# Patient Record
Sex: Male | Born: 1995 | Hispanic: Yes | Marital: Single | Smoking: Former smoker
Health system: Southern US, Community
[De-identification: ages and names within clinical notes are randomized; demographics above are authoritative.]

## PROBLEM LIST (undated history)

## (undated) HISTORY — PX: APPENDECTOMY: SHX54

---
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
## 2018-12-25 ENCOUNTER — Encounter: Payer: Self-pay | Admitting: Emergency Medicine

## 2018-12-25 ENCOUNTER — Other Ambulatory Visit: Payer: Self-pay

## 2018-12-25 DIAGNOSIS — R1013 Epigastric pain: Secondary | ICD-10-CM | POA: Diagnosis present

## 2018-12-25 DIAGNOSIS — K2920 Alcoholic gastritis without bleeding: Secondary | ICD-10-CM | POA: Diagnosis not present

## 2018-12-25 DIAGNOSIS — L739 Follicular disorder, unspecified: Secondary | ICD-10-CM | POA: Insufficient documentation

## 2018-12-25 DIAGNOSIS — R05 Cough: Secondary | ICD-10-CM | POA: Insufficient documentation

## 2018-12-25 DIAGNOSIS — F17210 Nicotine dependence, cigarettes, uncomplicated: Secondary | ICD-10-CM | POA: Insufficient documentation

## 2018-12-25 LAB — BASIC METABOLIC PANEL
Anion gap: 9 (ref 5–15)
BUN: 12 mg/dL (ref 6–20)
CO2: 24 mmol/L (ref 22–32)
Calcium: 9.3 mg/dL (ref 8.9–10.3)
Chloride: 103 mmol/L (ref 98–111)
Creatinine, Ser: 0.96 mg/dL (ref 0.61–1.24)
GFR calc Af Amer: >60 mL/min (ref >60)
GFR calc non Af Amer: >60 mL/min (ref >60)
Glucose, Bld: 105 mg/dL — ABNORMAL HIGH (ref 70–99)
Potassium: 3.1 mmol/L — ABNORMAL LOW (ref 3.5–5.1)
Sodium: 136 mmol/L (ref 135–145)

## 2018-12-25 LAB — CBC
HCT: 42.4 % (ref 39.0–52.0)
Hemoglobin: 14.8 g/dL (ref 13.0–17.0)
MCH: 35.3 pg — ABNORMAL HIGH (ref 26.0–34.0)
MCHC: 34.9 g/dL (ref 30.0–36.0)
MCV: 101.2 fL — ABNORMAL HIGH (ref 80.0–100.0)
Platelets: 324 10*3/uL (ref 150–400)
RBC: 4.19 MIL/uL — ABNORMAL LOW (ref 4.22–5.81)
RDW: 11.7 % (ref 11.5–15.5)
WBC: 24.5 10*3/uL — ABNORMAL HIGH (ref 4.0–10.5)
nRBC: 0 % (ref 0.0–0.2)

## 2018-12-25 LAB — TROPONIN I (HIGH SENSITIVITY): Troponin I (High Sensitivity): 4 ng/L (ref <18)

## 2018-12-25 NOTE — ED Triage Notes (Signed)
Pt presents to ED with mid chest pain for the past couple of days. Pt states worse when he is "breathing". Pt also concerned about a raised bumps on his inner thighs that he first noticed today. Pt currently has no increased work of breathing or acute distress noted.

## 2018-12-26 ENCOUNTER — Emergency Department
Admission: EM | Admit: 2018-12-26 | Discharge: 2018-12-26 | Disposition: A | Discharge status: Home / Self Care | Payer: BC Managed Care – PPO | Attending: Emergency Medicine | Admitting: Emergency Medicine

## 2018-12-26 ENCOUNTER — Emergency Department: Payer: BC Managed Care – PPO

## 2018-12-26 DIAGNOSIS — K2920 Alcoholic gastritis without bleeding: Secondary | ICD-10-CM

## 2018-12-26 DIAGNOSIS — R079 Chest pain, unspecified: Secondary | ICD-10-CM

## 2018-12-26 DIAGNOSIS — L739 Follicular disorder, unspecified: Secondary | ICD-10-CM

## 2018-12-26 DIAGNOSIS — R1013 Epigastric pain: Secondary | ICD-10-CM

## 2018-12-26 LAB — RAPID HIV SCREEN (HIV 1/2 AB+AG)
HIV 1/2 Antibodies: NONREACTIVE
HIV-1 P24 Antigen - HIV24: NONREACTIVE

## 2018-12-26 LAB — HEPATIC FUNCTION PANEL
ALT: 18 U/L (ref 0–44)
AST: 24 U/L (ref 15–41)
Albumin: 4.6 g/dL (ref 3.5–5.0)
Alkaline Phosphatase: 66 U/L (ref 38–126)
Bilirubin, Direct: 0.2 mg/dL (ref 0.0–0.2)
Indirect Bilirubin: 1.3 mg/dL — ABNORMAL HIGH (ref 0.3–0.9)
Total Bilirubin: 1.5 mg/dL — ABNORMAL HIGH (ref 0.3–1.2)
Total Protein: 7.6 g/dL (ref 6.5–8.1)

## 2018-12-26 LAB — TROPONIN I (HIGH SENSITIVITY): Troponin I (High Sensitivity): 3 ng/L (ref <18)

## 2018-12-26 LAB — LIPASE, BLOOD: Lipase: 23 U/L (ref 11–51)

## 2018-12-26 LAB — FIBRIN DERIVATIVES D-DIMER (ARMC ONLY): Fibrin derivatives D-dimer (ARMC): 303.05 ng/mL (FEU) (ref 0.00–499.00)

## 2018-12-26 MED ORDER — FAMOTIDINE 40 MG PO TABS: 40.0000 mg | ORAL_TABLET | Freq: Every day | ORAL | 0 refills | Status: DC

## 2018-12-26 MED ORDER — ALUM & MAG HYDROXIDE-SIMETH 200-200-20 MG/5ML PO SUSP
30.0000 mL | Freq: Once | ORAL | Status: AC
  Administered 2018-12-26: 30 mL via ORAL
  Filled 2018-12-26: qty 30

## 2018-12-26 MED ORDER — CLINDAMYCIN HCL 300 MG PO CAPS: 300.0000 mg | ORAL_CAPSULE | Freq: Three times a day (TID) | ORAL | 0 refills | Status: AC

## 2018-12-26 MED ORDER — FAMOTIDINE 20 MG PO TABS
20.0000 mg | ORAL_TABLET | Freq: Once | ORAL | Status: AC
  Administered 2018-12-26: 20 mg via ORAL
  Filled 2018-12-26: qty 1

## 2018-12-26 MED ORDER — CLINDAMYCIN HCL 150 MG PO CAPS
300.0000 mg | ORAL_CAPSULE | Freq: Once | ORAL | Status: AC
  Administered 2018-12-26: 300 mg via ORAL
  Filled 2018-12-26: qty 2

## 2018-12-26 MED ORDER — LIDOCAINE VISCOUS HCL 2 % MT SOLN
15.0000 mL | Freq: Once | OROMUCOSAL | Status: AC
  Administered 2018-12-26: 15 mL via ORAL
  Filled 2018-12-26: qty 15

## 2018-12-26 NOTE — ED Provider Notes (Signed)
[facility] Emergency Department Provider Note  ____________________________________________  Time seen: Approximately 2:24 AM  I have reviewed the triage vital signs and the nursing notes.   HISTORY  Chief Complaint Chest Pain   HPI Dean Alvarez is a 23 y.o. male no significant past medical history who presents for evaluation of epigastric pain and rash.  Patient reports an intermittent squeezing epigastric abdominal pain that he has had for a week.  The pain became worse today and it was severe.  At this time the pain has improved.  Currently 4 out of 10.  Has had no nausea, no vomiting, no chest pain, no shortness of breath, no fever or chills, no diarrhea.  Patient does report that over the last 3 to 4 weeks he has been drinking a lot of alcohol.  Has been drinking 12 beers a day every weekend.  Last alcohol intake was 2 days ago.  He denies any history of peptic ulcer disease.  He denies drug use.  He is a smoker.  He denies any personal or family history of heart attacks, blood clots, recent travel immobilization, leg pain or swelling, hemoptysis, or exogenous hormones.  He does report a mild cough for the last week that is productive of clear phlegm.  No fever or chills.  No known exposures to COVID.  Patient also noticed a rash that appeared in his bilateral inner thighs this morning.   PMH None - reviewed  Allergies Patient has no known allergies.  FH No history of CAD, DVT, PE  Social History Social History   Tobacco Use  . Smoking status: Current Every Day Smoker    Types: Cigarettes  . Smokeless tobacco: Never Used  Substance Use Topics  . Alcohol use: Yes    Frequency: Never  . Drug use: Not Currently    Review of Systems  Constitutional: Negative for fever. Eyes: Negative for visual changes. ENT: Negative for sore throat. Neck: No neck pain  Cardiovascular: Negative for chest pain. Respiratory: Negative for shortness of  breath. Gastrointestinal: + epigastric pain. Negative for abdominal pain, vomiting or diarrhea. Genitourinary: Negative for dysuria. Musculoskeletal: Negative for back pain. Skin: + rash. Neurological: Negative for headaches, weakness or numbness. Psych: No SI or HI  ____________________________________________   PHYSICAL EXAM:  VITAL SIGNS: ED Triage Vitals  Enc Vitals Group     BP 12/25/18 2144 137/80     Pulse Rate 12/25/18 2144 (!) 102     Resp 12/25/18 2144 18     Temp 12/25/18 2144 99.8 F (37.7 C)     Temp Source 12/25/18 2144 Oral     SpO2 12/25/18 2144 100 %     Weight 12/25/18 2145 140 lb (63.5 kg)     Height --      Head Circumference --      Peak Flow --      Pain Score 12/25/18 2144 8     Pain Loc --      Pain Edu? --      Excl. in GC? --     Constitutional: Alert and oriented. Well appearing and in no apparent distress. HEENT:      Head: Normocephalic and atraumatic.         Eyes: Conjunctivae are normal. Sclera is non-icteric.       Mouth/Throat: Mucous membranes are moist.       Neck: Supple with no signs of meningismus. Cardiovascular: Tachycardic with regular rhythm. No murmurs, gallops, or rubs.  2+ symmetrical distal pulses are present in all extremities. No JVD. Respiratory: Normal respiratory effort. Lungs are clear to auscultation bilaterally. No wheezes, crackles, or rhonchi.  Gastrointestinal: Soft, non tender, and non distended with positive bowel sounds. No rebound or guarding. Musculoskeletal: Nontender with normal range of motion in all extremities. No edema, cyanosis, or erythema of extremities. Neurologic: Normal speech and language. Face is symmetric. Moving all extremities. No gross focal neurologic deficits are appreciated. Skin: Skin is warm, dry and intact. There are several pustules on an erythematous warm base in the bilateral inner thighs. No involvement of the scrotum or penis Psychiatric: Mood and affect are normal. Speech and  behavior are normal.  ____________________________________________   LABS (all labs ordered are listed, but only abnormal results are displayed)  Labs Reviewed  BASIC METABOLIC PANEL - Abnormal; Notable for the following components:      Result Value   Potassium 3.1 (*)    Glucose, Bld 105 (*)    All other components within normal limits  CBC - Abnormal; Notable for the following components:   WBC 24.5 (*)    RBC 4.19 (*)    MCV 101.2 (*)    MCH 35.3 (*)    All other components within normal limits  HEPATIC FUNCTION PANEL - Abnormal; Notable for the following components:   Total Bilirubin 1.5 (*)    Indirect Bilirubin 1.3 (*)    All other components within normal limits  LIPASE, BLOOD  FIBRIN DERIVATIVES D-DIMER ([facility] ONLY)  RAPID HIV SCREEN (HIV 1/2 AB+AG)  TROPONIN I (HIGH SENSITIVITY)  TROPONIN I (HIGH SENSITIVITY)   ____________________________________________  EKG  ED ECG REPORT I, Nita Sicklearolina Kimanh Templeman, the attending physician, personally viewed and interpreted this ECG.  Sinus tachycardia, rate of 117, normal intervals, normal axis, no ST elevations or depressions, T wave inversions in inferior and lateral leads.  No prior for comparison. ____________________________________________  RADIOLOGY  I have personally reviewed the images performed during this visit and I agree with the Radiologist's read.   Interpretation by Radiologist:  Dg Chest Port 1 View  Result Date: 12/26/2018 CLINICAL DATA:  23 year old male with chest pain. EXAM: PORTABLE CHEST 1 VIEW COMPARISON:  None. FINDINGS: Portable AP upright view at 0118 hours. Lung volumes and mediastinal contours are within normal limits. Visualized tracheal air column is within normal limits. Allowing for portable technique the lungs are clear. No pneumothorax. Negative visible bowel gas and osseous structures. IMPRESSION: Negative portable chest. Electronically Signed   By: Odessa FlemingH  Hall M.D.   On: 12/26/2018 01:31      ____________________________________________   PROCEDURES  Procedure(s) performed: None Procedures Critical Care performed:  None ____________________________________________   INITIAL IMPRESSION / ASSESSMENT AND PLAN / ED COURSE   23 y.o. male no significant past medical history who presents for evaluation of epigastric pain and rash.  # intermittent epigastric abdominal pain: No tenderness on exam, lungs CTAB, tachycardic, no respiratory distress. + mild cough and heavy alcohol use. Ddx gastritis vs PUD vs pancreatitis vs GB. Heart score if 2, HS troponin x2 negative. Low suspicion for ACS. D-dimer negative. CXR negative for PNA. No fever, no SOB, no myalgias therefore less likely covid.  LFTs and lipase within normal limits.  # rash: patient with folliculitis of bilateral inner thighs. Has elevated WBC with no other signs of infection elsewhere. Started on clindamycin.    Discussed and return precautions and follow-up with primary care doctor.    As part of my medical decision making, I reviewed  the following data within the [place] notes reviewed and incorporated, Labs reviewed , EKG interpreted , Old chart reviewed, Radiograph reviewed , Notes from prior ED visits and [place] Controlled Substance Database   Patient was evaluated in Emergency Department today for the symptoms described in the history of present illness. Patient was evaluated in the context of the global COVID-19 pandemic, which necessitated consideration that the patient might be at risk for infection with the SARS-CoV-2 virus that causes COVID-19. Institutional protocols and algorithms that pertain to the evaluation of patients at risk for COVID-19 are in a state of rapid change based on information released by regulatory bodies including the CDC and federal and state organizations. These policies and algorithms were followed during the patient's care in the ED.    ____________________________________________   FINAL CLINICAL IMPRESSION(S) / ED DIAGNOSES   Final diagnoses:  Epigastric pain  Folliculitis  Acute alcoholic gastritis without hemorrhage      NEW MEDICATIONS STARTED DURING THIS VISIT:  ED Discharge Orders         Ordered    clindamycin (CLEOCIN) 300 MG capsule  3 times daily     12/26/18 0302    famotidine (PEPCID) 40 MG tablet  Daily at bedtime     12/26/18 0302           Note:  This document was prepared using Dragon voice recognition software and may include unintentional dictation errors.    Alfred Levins, [place], MD 12/26/18 [phone]

## 2018-12-26 NOTE — ED Notes (Signed)
Provided urinal for patient to void

## 2018-12-26 NOTE — ED Notes (Signed)
Patient has pustular rash on groin and thighs

## 2020-05-13 ENCOUNTER — Ambulatory Visit
Admission: EM | Admit: 2020-05-13 | Discharge: 2020-05-13 | Disposition: A | Discharge status: Home / Self Care | Payer: BC Managed Care – PPO | Attending: Family Medicine | Admitting: Family Medicine

## 2020-05-13 ENCOUNTER — Other Ambulatory Visit: Payer: Self-pay

## 2020-05-13 ENCOUNTER — Encounter: Payer: Self-pay | Admitting: Emergency Medicine

## 2020-05-13 DIAGNOSIS — U071 COVID-19: Secondary | ICD-10-CM | POA: Diagnosis not present

## 2020-05-13 LAB — RESP PANEL BY RT-PCR (FLU A&B, COVID) ARPGX2
Influenza A by PCR: NEGATIVE
Influenza B by PCR: NEGATIVE
SARS Coronavirus 2 by RT PCR: POSITIVE — AB

## 2020-05-13 LAB — GROUP A STREP BY PCR: Group A Strep by PCR: NOT DETECTED

## 2020-05-13 MED ORDER — ALBUTEROL SULFATE HFA 108 (90 BASE) MCG/ACT IN AERS: 1.0000 | INHALATION_SPRAY | Freq: Four times a day (QID) | RESPIRATORY_TRACT | 0 refills | Status: DC | PRN

## 2020-05-13 MED ORDER — NAPROXEN 500 MG PO TABS: 500.0000 mg | ORAL_TABLET | Freq: Two times a day (BID) | ORAL | 0 refills | Status: DC | PRN

## 2020-05-13 NOTE — ED Provider Notes (Signed)
[facility]    CSN: [id number] Arrival date & time: 05/13/20  1200  History   Chief Complaint Chief Complaint  Patient presents with   Generalized Body Aches   Sore Throat   Headache   Cough   Nasal Congestion   HPI  24 year old male presents with the above complaints.  2-day history of symptoms.  Reports body aches, headache, sore throat, difficulty breathing.  No documented fever.  He has taken over-the-counter Tylenol without relief.  He has not been vaccinated.  No reported direct sick contacts.  No other associated symptoms.  No other complaints.  Past Surgical History:  Procedure Laterality Date   APPENDECTOMY     Home Medications    Prior to Admission medications   Medication Sig Start Date End Date Taking? Authorizing Provider  albuterol (VENTOLIN HFA) 108 (90 Base) MCG/ACT inhaler Inhale 1-2 puffs into the lungs every 6 (six) hours as needed for wheezing or shortness of breath. 05/13/20  Yes Jamelah Sitzer G, DO  naproxen (NAPROSYN) 500 MG tablet Take 1 tablet (500 mg total) by mouth 2 (two) times daily as needed for moderate pain. 05/13/20  Yes Arvon Schreiner G, DO  famotidine (PEPCID) 40 MG tablet Take 1 tablet (40 mg total) by mouth at bedtime. 12/26/18 05/13/20  Nita Sickle, MD    Family History Family History  Problem Relation Age of Onset   Cancer Mother    Hyperlipidemia Father     Social History Social History   Tobacco Use   Smoking status: Former Smoker    Types: Cigarettes    Quit date: 11/12/2019    Years since quitting: 0.5   Smokeless tobacco: Never Used  Vaping Use   Vaping Use: Never used  Substance Use Topics   Alcohol use: Yes    Comment: sometimes   Drug use: Not Currently     Allergies   Patient has no known allergies.   Review of Systems Review of Systems Per HPI  Physical Exam Triage Vital Signs ED Triage Vitals  Enc Vitals Group     BP 05/13/20 1501 122/75     Pulse Rate 05/13/20 1501 88      Resp 05/13/20 1501 18     Temp 05/13/20 1501 98.9 F (37.2 C)     Temp Source 05/13/20 1501 Oral     SpO2 05/13/20 1501 98 %     Weight 05/13/20 1502 193 lb (87.5 kg)     Height 05/13/20 1502 5\' 7"  (1.702 m)     Head Circumference --      Peak Flow --      Pain Score 05/13/20 1501 8     Pain Loc --      Pain Edu? --      Excl. in GC? --     Updated Vital Signs BP 122/75 (BP Location: Left Arm)    Pulse 88    Temp 98.9 F (37.2 C) (Oral)    Resp 18    Ht 5\' 7"  (1.702 m)    Wt 87.5 kg    SpO2 98%    BMI 30.23 kg/m   Visual Acuity Right Eye Distance:   Left Eye Distance:   Bilateral Distance:    Right Eye Near:   Left Eye Near:    Bilateral Near:     Physical Exam Vitals and nursing note reviewed.  Constitutional:      General: He is not in acute distress.    Appearance: Normal appearance. He  is not ill-appearing.  HENT:     Head: Normocephalic and atraumatic.     Mouth/Throat:     Pharynx: Oropharynx is clear. No oropharyngeal exudate.  Eyes:     General:        Right eye: No discharge.        Left eye: No discharge.     Conjunctiva/sclera: Conjunctivae normal.  Cardiovascular:     Rate and Rhythm: Normal rate and regular rhythm.     Heart sounds: No murmur heard.   Pulmonary:     Effort: Pulmonary effort is normal.     Breath sounds: Normal breath sounds. No wheezing, rhonchi or rales.  Neurological:     Mental Status: He is alert.  Psychiatric:        Mood and Affect: Mood normal.        Behavior: Behavior normal.    UC Treatments / Results  Labs (all labs ordered are listed, but only abnormal results are displayed) Labs Reviewed  RESP PANEL BY RT-PCR (FLU A&B, COVID) ARPGX2 - Abnormal; Notable for the following components:      Result Value   SARS Coronavirus 2 by RT PCR POSITIVE (*)    All other components within normal limits  GROUP A STREP BY PCR    EKG   Radiology No results found.  Procedures Procedures (including critical care  time)  Medications Ordered in UC Medications - No data to display  Initial Impression / Assessment and Plan / UC Course  I have reviewed the triage vital signs and the nursing notes.  Pertinent labs & imaging results that were available during my care of the patient were reviewed by me and considered in my medical decision making (see chart for details).    24 year old male presents with COVID-19.  Well-appearing on exam.  Afebrile.  Naproxen and albuterol as directed.  Work note given.  Advised to go to the hospital if he worsens.  Final Clinical Impressions(s) / UC Diagnoses   Final diagnoses:  COVID   Discharge Instructions   None    ED Prescriptions    Medication Sig Dispense Auth. Provider   naproxen (NAPROSYN) 500 MG tablet Take 1 tablet (500 mg total) by mouth 2 (two) times daily as needed for moderate pain. 30 tablet Aydenn Gervin G, DO   albuterol (VENTOLIN HFA) 108 (90 Base) MCG/ACT inhaler Inhale 1-2 puffs into the lungs every 6 (six) hours as needed for wheezing or shortness of breath. 18 g Tommie Sams, DO     PDMP not reviewed this encounter.   Tommie Sams, [place] 05/13/20 1627

## 2020-05-13 NOTE — ED Triage Notes (Signed)
Patient in today c/o a 2 day history of body aches, cough, sore throat, headache and nasal congestion. Patient has not checked his temperature. Patient has taken OTC Tylenol. Patient has not had the covid vaccines.

## 2020-05-22 ENCOUNTER — Ambulatory Visit
Admission: EM | Admit: 2020-05-22 | Discharge: 2020-05-22 | Disposition: A | Discharge status: Home / Self Care | Payer: BC Managed Care – PPO | Attending: Physician Assistant | Admitting: Physician Assistant

## 2020-05-22 ENCOUNTER — Other Ambulatory Visit: Payer: Self-pay

## 2020-05-22 ENCOUNTER — Encounter: Payer: Self-pay | Admitting: Emergency Medicine

## 2020-05-22 ENCOUNTER — Ambulatory Visit (INDEPENDENT_AMBULATORY_CARE_PROVIDER_SITE_OTHER): Payer: BC Managed Care – PPO

## 2020-05-22 DIAGNOSIS — U071 COVID-19: Secondary | ICD-10-CM

## 2020-05-22 DIAGNOSIS — R059 Cough, unspecified: Secondary | ICD-10-CM

## 2020-05-22 DIAGNOSIS — R0602 Shortness of breath: Secondary | ICD-10-CM | POA: Diagnosis not present

## 2020-05-22 DIAGNOSIS — J1282 Pneumonia due to coronavirus disease 2019: Secondary | ICD-10-CM

## 2020-05-22 MED ORDER — ALBUTEROL SULFATE HFA 108 (90 BASE) MCG/ACT IN AERS: 1.0000 | INHALATION_SPRAY | Freq: Four times a day (QID) | RESPIRATORY_TRACT | 0 refills | Status: DC | PRN

## 2020-05-22 MED ORDER — PSEUDOEPH-BROMPHEN-DM 30-2-10 MG/5ML PO SYRP: 10.0000 mL | ORAL_SOLUTION | Freq: Four times a day (QID) | ORAL | 0 refills | Status: AC | PRN

## 2020-05-22 MED ORDER — PREDNISONE 20 MG PO TABS: 40.0000 mg | ORAL_TABLET | Freq: Every day | ORAL | 0 refills | Status: AC

## 2020-05-22 NOTE — ED Provider Notes (Signed)
[facility]    CSN: [id number] Arrival date & time: 05/22/20  1344      History   Chief Complaint Chief Complaint  Patient presents with  . Covid Positive  . Cough    HPI Dean Alvarez is a 25 y.o. male, COVID unvaccinated, presenting for 11-day history of cough shortness of breath.  Patient was diagnosed with COVID-19 on 05/13/2020.  Patient says that he has completed his isolation and does not feel much better.  He says he continues to have some symptoms including those mentioned as well as loss of taste.  Patient says that over the past 5 to 6 days he started to feel some chest burning and worsening shortness of breath.  He also says that his cough is occasionally productive of mucus.  He says he has been using his albuterol inhaler as provided at the last visit every 6 hours like it says on the prescription.  He says he is also taking the naproxen.  He denies taking any other medications.  Patient says he is otherwise healthy without any chronic medical problems.  Denies any history of cardiopulmonary disease.  He has no other concerns today.  HPI  History reviewed. No pertinent past medical history.  There are no problems to display for this patient.   Past Surgical History:  Procedure Laterality Date  . APPENDECTOMY         Home Medications    Prior to Admission medications   Medication Sig Start Date End Date Taking? Authorizing Provider  brompheniramine-pseudoephedrine-DM 30-2-10 MG/5ML syrup Take 10 mLs by mouth 4 (four) times daily as needed for up to 7 days. 05/22/20 05/29/20 Yes Laurene Footman B, PA-C  naproxen (NAPROSYN) 500 MG tablet Take 1 tablet (500 mg total) by mouth 2 (two) times daily as needed for moderate pain. 05/13/20  Yes Cook, Jayce G, DO  predniSONE (DELTASONE) 20 MG tablet Take 2 tablets (40 mg total) by mouth daily for 5 days. 05/22/20 05/27/20 Yes Danton Clap, PA-C  albuterol (VENTOLIN HFA) 108 (90 Base) MCG/ACT inhaler Inhale 1-2  puffs into the lungs every 6 (six) hours as needed for up to 10 days for wheezing or shortness of breath. 05/22/20 06/01/20  Danton Clap, PA-C  famotidine (PEPCID) 40 MG tablet Take 1 tablet (40 mg total) by mouth at bedtime. 12/26/18 05/13/20  Rudene Re, MD    Family History Family History  Problem Relation Age of Onset  . Cancer Mother   . Hyperlipidemia Father     Social History Social History   Tobacco Use  . Smoking status: Former Smoker    Types: Cigarettes    Quit date: 11/12/2019    Years since quitting: 0.5  . Smokeless tobacco: Never Used  Vaping Use  . Vaping Use: Never used  Substance Use Topics  . Alcohol use: Yes    Comment: sometimes  . Drug use: Not Currently     Allergies   Patient has no known allergies.   Review of Systems Review of Systems  Constitutional: Negative for fatigue and fever.  HENT: Positive for congestion. Negative for rhinorrhea, sinus pressure, sinus pain and sore throat.   Respiratory: Positive for cough and shortness of breath. Negative for wheezing.   Cardiovascular: Negative for chest pain.  Gastrointestinal: Negative for abdominal pain, diarrhea, nausea and vomiting.  Musculoskeletal: Negative for myalgias.  Neurological: Negative for weakness, light-headedness and headaches.  Hematological: Negative for adenopathy.     Physical Exam Triage Vital  Signs ED Triage Vitals  Enc Vitals Group     BP 05/22/20 1521 102/60     Pulse Rate 05/22/20 1521 94     Resp 05/22/20 1521 18     Temp 05/22/20 1521 98.1 F (36.7 C)     Temp Source 05/22/20 1521 Oral     SpO2 05/22/20 1521 98 %     Weight 05/22/20 1519 190 lb (86.2 kg)     Height 05/22/20 1519 5\' 7"  (1.702 m)     Head Circumference --      Peak Flow --      Pain Score 05/22/20 1519 0     Pain Loc --      Pain Edu? --      Excl. in GC? --    No data found.  Updated Vital Signs BP 102/60 (BP Location: Right Arm)   Pulse 94   Temp 98.1 F (36.7 C) (Oral)    Resp 18   Ht 5\' 7"  (1.702 m)   Wt 190 lb (86.2 kg)   SpO2 98%   BMI 29.76 kg/m    Physical Exam Vitals and nursing note reviewed.  Constitutional:      General: He is not in acute distress.    Appearance: Normal appearance. He is well-developed and well-nourished. He is not ill-appearing or diaphoretic.  HENT:     Head: Normocephalic and atraumatic.     Nose: Nose normal.     Mouth/Throat:     Mouth: Oropharynx is clear and moist and mucous membranes are normal. Mucous membranes are moist.     Pharynx: Oropharynx is clear. Uvula midline. No oropharyngeal exudate.     Tonsils: No tonsillar abscesses.  Eyes:     General: No scleral icterus.       Right eye: No discharge.        Left eye: No discharge.     Extraocular Movements: EOM normal.     Conjunctiva/sclera: Conjunctivae normal.  Neck:     Thyroid: No thyromegaly.     Trachea: No tracheal deviation.  Cardiovascular:     Rate and Rhythm: Normal rate and regular rhythm.     Heart sounds: Normal heart sounds.  Pulmonary:     Effort: Pulmonary effort is normal. No respiratory distress.     Breath sounds: Normal breath sounds. No wheezing, rhonchi or rales.  Musculoskeletal:     Cervical back: Neck supple.  Skin:    General: Skin is warm and dry.     Findings: No rash.  Neurological:     General: No focal deficit present.     Mental Status: He is alert. Mental status is at baseline.     Motor: No weakness.     Gait: Gait normal.  Psychiatric:        Mood and Affect: Mood normal.        Thought Content: Thought content normal.      UC Treatments / Results  Labs (all labs ordered are listed, but only abnormal results are displayed) Labs Reviewed - No data to display  EKG   Radiology DG Chest 2 View  Result Date: 05/22/2020 CLINICAL DATA:  Cough and shortness of breath.  COVID positive EXAM: CHEST - 2 VIEW COMPARISON:  12/26/2018 FINDINGS: There are few subtle airspace opacities bilaterally most notably in the  lower lung zones. No pneumothorax. No large pleural effusion. The heart size is unremarkable. There is no acute osseous abnormality. IMPRESSION: Subtle bilateral airspace opacities as can  be seen in patients with viral pneumonia. Electronically Signed   By: Katherine Mantle M.D.   On: 05/22/2020 15:43    Procedures Procedures (including critical care time)  Medications Ordered in UC Medications - No data to display  Initial Impression / Assessment and Plan / UC Course  I have reviewed the triage vital signs and the nursing notes.  Pertinent labs & imaging results that were available during my care of the patient were reviewed by me and considered in my medical decision making (see chart for details).   All vital signs are normal and stable.  Exam is benign.  Chest x-ray performed today due to complaint of continued shortness of breath and cough after COVID-19 diagnosis a week and half ago. CXR independently reviewed by me.  Chest x-ray shows subtle bilateral airspace opacities which seem to be consistent with Covid viral pneumonia.  Discussed results with patient.  Advised him to continue using the inhaler, but advised him to only use as needed and not every 6 hours if he does not need it.  I have sent Bromfed syrup to help with cough.  Also prescribed prednisone.  I refilled his albuterol inhaler.  Return and ED precautions reviewed with patient.   Final Clinical Impressions(s) / UC Diagnoses   Final diagnoses:  Pneumonia due to COVID-19 virus  Cough  Shortness of breath     Discharge Instructions     La radiografa de trax muestra neumona compatible con COVID-19. Mejorar, pero puede tomar un poco ms de [employer]. Sus Risk analyst y el gusto pueden durar semanas o meses a veces.  Use el inhalador de albuterol cada 4 a 6 horas segn sea necesario para la dificultad para respirar. Si no lo necesita, no lo use.  Tambin he enviado un medicamento para la tos y Chartered certified accountant. Use este medicamento cada 6 horas segn sea necesario para la tos y la congestin.  Adems, le he enviado prednisona que le ayudar a disminuir la inflamacin y [employer]. Tomar 2 comprimidos por la [employer].  Si tiene fiebre, empeora la tos o aumenta la dificultad para Industrial/product designer, debe volver a verlo.  Chest x-ray shows pneumonia consistent with COVID-19.  You will get better, but it may take a little longer to do so.  Your smell and taste changes can last for weeks or months sometimes.  Use the albuterol inhaler every 4-6 hours as needed for shortness of breath.  If you do not need it do not use it.  I have sent a medication for cough and congestion as well.  Use this medication every 6 hours as needed for cough and congestion.  Also, I have sent prednisone which will help to decrease the inflammation and open up your lungs.  Take 2 tablets in the morning for 5 days.  If you develop fever, have worse cough, or increased breathing difficulty, you should be seen again.    ED Prescriptions    Medication Sig Dispense Auth. Provider   albuterol (VENTOLIN HFA) 108 (90 Base) MCG/ACT inhaler Inhale 1-2 puffs into the lungs every 6 (six) hours as needed for up to 10 days for wheezing or shortness of breath. 18 g Eusebio Friendly B, PA-C   brompheniramine-pseudoephedrine-DM 30-2-10 MG/5ML syrup Take 10 mLs by mouth 4 (four) times daily as needed for up to 7 days. 150 mL Eusebio Friendly B, PA-C   predniSONE (DELTASONE) 20 MG tablet Take 2 tablets (40 mg  total) by mouth daily for 5 days. 10 tablet Gareth Morgan     PDMP not reviewed this encounter.   Shirlee Latch, PA-C 05/22/20 1622

## 2020-05-22 NOTE — Discharge Instructions (Addendum)
La radiografa de trax muestra neumona compatible con COVID-19. Mejorar, pero puede tomar un poco ms de [employer]. Sus Risk analyst y el gusto pueden durar semanas o meses a veces.  Use el inhalador de albuterol cada 4 a 6 horas segn sea necesario para la dificultad para respirar. Si no lo necesita, no lo use.  Tambin he enviado un medicamento para la tos y Sales executive. Use este medicamento cada 6 horas segn sea necesario para la tos y la congestin.  Adems, le he enviado prednisona que le ayudar a disminuir la inflamacin y [employer]. Tomar 2 comprimidos por la [employer].  Si tiene fiebre, empeora la tos o aumenta la dificultad para Industrial/product designer, debe volver a verlo.  Chest x-ray shows pneumonia consistent with COVID-19.  You will get better, but it may take a little longer to do so.  Your smell and taste changes can last for weeks or months sometimes.  Use the albuterol inhaler every 4-6 hours as needed for shortness of breath.  If you do not need it do not use it.  I have sent a medication for cough and congestion as well.  Use this medication every 6 hours as needed for cough and congestion.  Also, I have sent prednisone which will help to decrease the inflammation and open up your lungs.  Take 2 tablets in the morning for 5 days.  If you develop fever, have worse cough, or increased breathing difficulty, you should be seen again.

## 2020-05-22 NOTE — ED Triage Notes (Signed)
Patient states he was COVID positive 10 days ago. He states he has continued to have SOB and cough. He still reports loss of taste.

## 2020-05-24 ENCOUNTER — Other Ambulatory Visit: Payer: Self-pay

## 2020-05-24 ENCOUNTER — Ambulatory Visit
Admission: EM | Admit: 2020-05-24 | Discharge: 2020-05-24 | Discharge status: Against Medical Advice | Payer: BC Managed Care – PPO

## 2021-07-26 IMAGING — CR DG CHEST 2V
3 series · 3 of 3 positions shown · non-contrast
Comparison: 12/26/2018

CLINICAL DATA: Cough and shortness of breath.  COVID positive

EXAM:
CHEST - 2 VIEW

[chest pa]
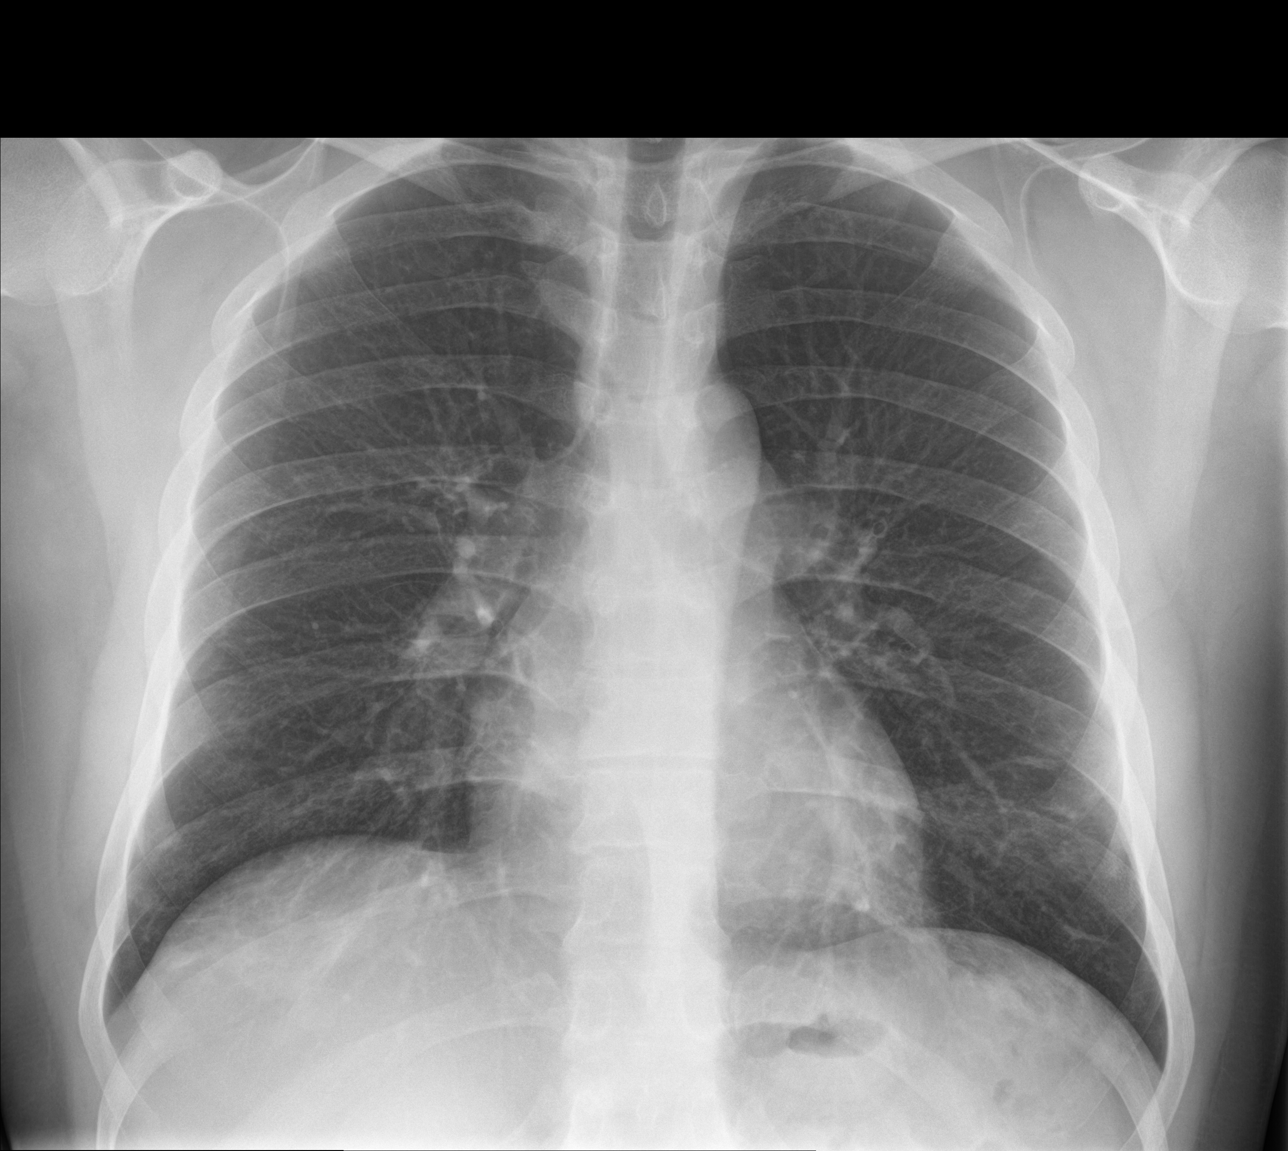

[chest lat (1 of 2)]
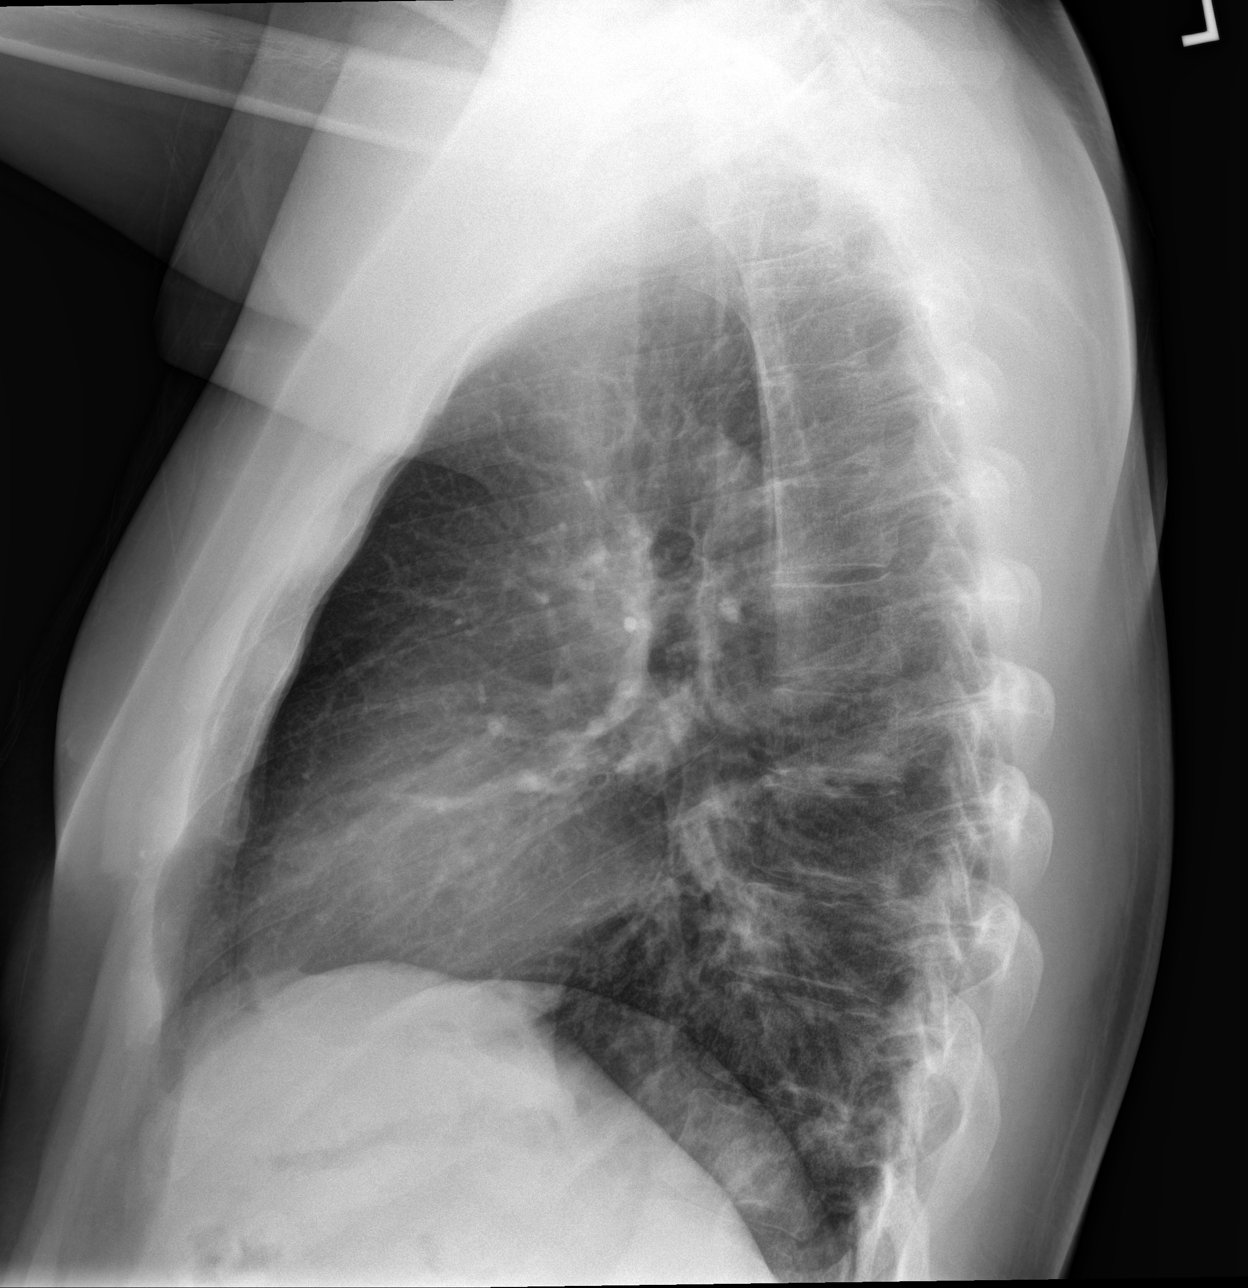

[chest lat (2 of 2)]
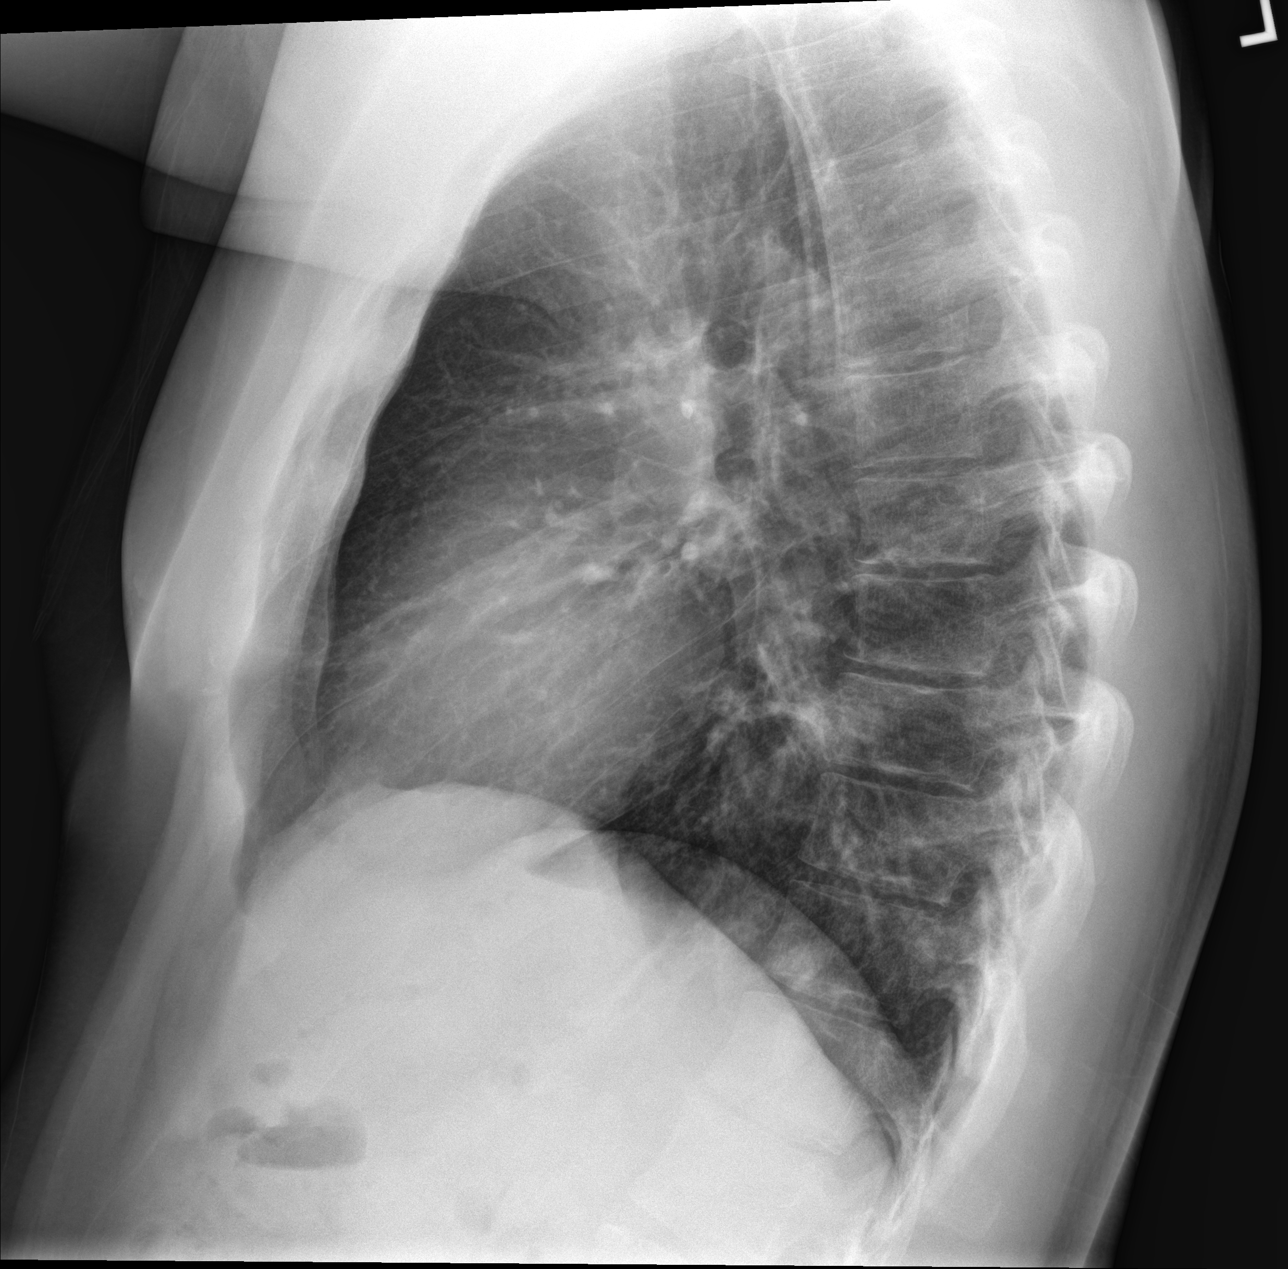

[3 of 3 positions shown; findings below may reference images not displayed]

FINDINGS: There are few subtle airspace opacities bilaterally most notably in
the lower lung zones. No pneumothorax. No large pleural effusion.
The heart size is unremarkable. There is no acute osseous
abnormality.
IMPRESSION: Subtle bilateral airspace opacities as can be seen in patients with
viral pneumonia.

## 2023-05-21 ENCOUNTER — Encounter: Payer: Self-pay | Admitting: Emergency Medicine

## 2023-05-21 ENCOUNTER — Ambulatory Visit (INDEPENDENT_AMBULATORY_CARE_PROVIDER_SITE_OTHER): Payer: BLUE CROSS/BLUE SHIELD

## 2023-05-21 ENCOUNTER — Ambulatory Visit
Admission: EM | Admit: 2023-05-21 | Discharge: 2023-05-21 | Disposition: A | Discharge status: Home / Self Care | Payer: BLUE CROSS/BLUE SHIELD | Attending: Emergency Medicine | Admitting: Emergency Medicine

## 2023-05-21 DIAGNOSIS — S20212A Contusion of left front wall of thorax, initial encounter: Secondary | ICD-10-CM | POA: Diagnosis not present

## 2023-05-21 DIAGNOSIS — R0781 Pleurodynia: Secondary | ICD-10-CM | POA: Diagnosis not present

## 2023-05-21 MED ORDER — HYDROCODONE-ACETAMINOPHEN 5-325 MG PO TABS: 1.0000 | ORAL_TABLET | Freq: Four times a day (QID) | ORAL | 0 refills | Status: AC | PRN

## 2023-05-21 MED ORDER — METHYLPREDNISOLONE 4 MG PO TBPK: ORAL_TABLET | Freq: Every day | ORAL | 0 refills | Status: AC

## 2023-05-21 MED ORDER — MELOXICAM 15 MG PO TABS: 15.0000 mg | ORAL_TABLET | Freq: Every day | ORAL | 0 refills | Status: AC

## 2023-05-21 NOTE — Discharge Instructions (Addendum)
 We did not appreciate any displaced rib fracture, infection or fluid in your lung on today's x-ray.  Take the Mobic  once a day with 1000 mg of Tylenol .  You may take a Tylenol  containing products 2 more times a day as needed.  You can take 1000 mg of Tylenol  for mild to moderate pain or 1-2 Norco for severe pain.  Either take the Tylenol  or the Norco 3 times a day, do not take both.  They both have Tylenol  in them, and too much Tylenol  can hurt your liver.  Do not exceed 4000 mg of Tylenol  from all sources in 24 hours.  The Medrol  Dosepak may help with inflammation, you can try over-the-counter lidocaine  patches in the area of pain.  Go to the ER for coughing, worsening shortness of breath, coughing up blood, fevers above 100.4, pain not controlled with medications, or for other concerns.  Here is a list of primary care providers who are taking new patients:  [facility] [place] Dr. Selinda Ku (sports medicine) Dr. Cathryne Molt [address] [place] [address] [phone]  [facility] at [facility] [address] [place], [place] [address] [phone]  [facility] [place] [address]  [place] [address]  [phone]  [facility] [address] [place], [place] [address] [phone]  [facility] [address] [place]  [phone] [place], [place] [address]  Here are clinics/ other resources who will see you if you do not have insurance. Some have certain criteria that you must meet. Call them and find out what they are:  [facility]: [address], [place], [place] [address] Phone: [phone] Hours: First and Third Saturdays of each Month, 9 a.m. - 1 p.m.  [facility]: [address], [address] [place], [place] [address] Phone: [phone] Hours: Tuesday, 4 p.m. - 8 p.m. Thursday, 1 p.m. - 8 p.m. Wednesday, 9 a.m. - [facility] [address], [place], [place] [address] Phone:  [phone] Pharmacy Phone Number: [phone] Dental Phone Number: [phone] [facility] Insurance Help: [phone]  Dental Hours: Monday - Thursday, 8 a.m. - 6 p.m.  [facility] [address], [place], [place] [address] Phone: [phone] Pharmacy Phone Number: [phone] [facility] Insurance Help: [phone]  [facility] [address] [place], [place] [address] Phone: [phone] Pharmacy Phone Number: [phone] [facility] Insurance Help: [phone]  [facility] [address] [place], [place] [address] Phone: [phone] [facility] Insurance Help: [phone]   [facility] [address], [place], [place] [address] Phone: [phone]  Go to www.goodrx.com  or www.costplusdrugs.com to look up your medications. This will give you a list of where you can find your prescriptions at the most affordable prices. Or ask the pharmacist what the cash price is, or if they have any other discount programs available to help make your medication more affordable. This can be less expensive than what you would pay with insurance.
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
## 2023-05-23 ENCOUNTER — Encounter: Payer: Self-pay | Admitting: Radiology

## 2023-05-23 ENCOUNTER — Emergency Department
Admission: EM | Admit: 2023-05-23 | Discharge: 2023-05-23 | Disposition: A | Discharge status: Home / Self Care | Payer: BLUE CROSS/BLUE SHIELD | Attending: Student in an Organized Health Care Education/Training Program | Admitting: Student in an Organized Health Care Education/Training Program

## 2023-05-23 ENCOUNTER — Emergency Department: Payer: BLUE CROSS/BLUE SHIELD

## 2023-05-23 ENCOUNTER — Other Ambulatory Visit: Payer: Self-pay

## 2023-05-23 DIAGNOSIS — J40 Bronchitis, not specified as acute or chronic: Secondary | ICD-10-CM

## 2023-05-23 DIAGNOSIS — X58XXXD Exposure to other specified factors, subsequent encounter: Secondary | ICD-10-CM | POA: Insufficient documentation

## 2023-05-23 DIAGNOSIS — Z20822 Contact with and (suspected) exposure to covid-19: Secondary | ICD-10-CM | POA: Diagnosis not present

## 2023-05-23 DIAGNOSIS — R109 Unspecified abdominal pain: Secondary | ICD-10-CM | POA: Diagnosis present

## 2023-05-23 DIAGNOSIS — S2232XD Fracture of one rib, left side, subsequent encounter for fracture with routine healing: Secondary | ICD-10-CM | POA: Diagnosis not present

## 2023-05-23 LAB — BASIC METABOLIC PANEL
Anion gap: 16 — ABNORMAL HIGH (ref 5–15)
BUN: 18 mg/dL (ref 6–20)
CO2: 23 mmol/L (ref 22–32)
Calcium: 9.8 mg/dL (ref 8.9–10.3)
Chloride: 100 mmol/L (ref 98–111)
Creatinine, Ser: 0.87 mg/dL (ref 0.61–1.24)
GFR, Estimated: >60 mL/min (ref >60)
Glucose, Bld: 104 mg/dL — ABNORMAL HIGH (ref 70–99)
Potassium: 3.9 mmol/L (ref 3.5–5.1)
Sodium: 139 mmol/L (ref 135–145)

## 2023-05-23 LAB — CBC WITH DIFFERENTIAL/PLATELET
Abs Immature Granulocytes: 0.08 10*3/uL — ABNORMAL HIGH (ref 0.00–0.07)
Basophils Absolute: 0 10*3/uL (ref 0.0–0.1)
Basophils Relative: 0 %
Eosinophils Absolute: 0 10*3/uL (ref 0.0–0.5)
Eosinophils Relative: 0 %
HCT: 45.5 % (ref 39.0–52.0)
Hemoglobin: 15.5 g/dL (ref 13.0–17.0)
Immature Granulocytes: 0 %
Lymphocytes Relative: 11 %
Lymphs Abs: 2 10*3/uL (ref 0.7–4.0)
MCH: 35.1 pg — ABNORMAL HIGH (ref 26.0–34.0)
MCHC: 34.1 g/dL (ref 30.0–36.0)
MCV: 103.2 fL — ABNORMAL HIGH (ref 80.0–100.0)
Monocytes Absolute: 1.3 10*3/uL — ABNORMAL HIGH (ref 0.1–1.0)
Monocytes Relative: 7 %
Neutro Abs: 14.7 10*3/uL — ABNORMAL HIGH (ref 1.7–7.7)
Neutrophils Relative %: 82 %
Platelets: 498 10*3/uL — ABNORMAL HIGH (ref 150–400)
RBC: 4.41 MIL/uL (ref 4.22–5.81)
RDW: 12.5 % (ref 11.5–15.5)
WBC: 18.1 10*3/uL — ABNORMAL HIGH (ref 4.0–10.5)
nRBC: 0 % (ref 0.0–0.2)

## 2023-05-23 LAB — D-DIMER, QUANTITATIVE: D-Dimer, Quant: <0.27 ug{FEU}/mL (ref 0.00–0.50)

## 2023-05-23 LAB — SARS CORONAVIRUS 2 BY RT PCR: SARS Coronavirus 2 by RT PCR: NEGATIVE

## 2023-05-23 MED ORDER — ACETAMINOPHEN 325 MG PO TABS
650.0000 mg | ORAL_TABLET | Freq: Once | ORAL | Status: AC
  Administered 2023-05-23: 650 mg via ORAL
  Filled 2023-05-23: qty 2

## 2023-05-23 MED ORDER — TRAMADOL HCL 50 MG PO TABS: 50.0000 mg | ORAL_TABLET | Freq: Four times a day (QID) | ORAL | 0 refills | Status: AC | PRN

## 2023-05-23 MED ORDER — IOHEXOL 300 MG/ML  SOLN
100.0000 mL | Freq: Once | INTRAMUSCULAR | Status: AC | PRN
Start: 2023-05-23 — End: 2023-05-23
  Administered 2023-05-23: 100 mL via INTRAVENOUS

## 2023-05-23 MED ORDER — AZITHROMYCIN 500 MG PO TABS
500.0000 mg | ORAL_TABLET | Freq: Once | ORAL | Status: AC
  Administered 2023-05-23: 500 mg via ORAL
  Filled 2023-05-23: qty 1

## 2023-05-23 MED ORDER — ALBUTEROL SULFATE HFA 108 (90 BASE) MCG/ACT IN AERS: 2.0000 | INHALATION_SPRAY | Freq: Four times a day (QID) | RESPIRATORY_TRACT | 2 refills | Status: AC | PRN

## 2023-05-23 MED ORDER — AZITHROMYCIN 250 MG PO TABS: ORAL_TABLET | ORAL | 0 refills | Status: AC
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
